# Patient Record
Sex: Female | Born: 1990 | Race: White | Hispanic: No | Marital: Single | Smoking: Never smoker
Health system: Southern US, Community
[De-identification: ages and names within clinical notes are randomized; demographics above are authoritative.]

## PROBLEM LIST (undated history)

## (undated) DIAGNOSIS — L309 Dermatitis, unspecified: Secondary | ICD-10-CM

---

## 2014-04-06 DIAGNOSIS — Z8742 Personal history of other diseases of the female genital tract: Secondary | ICD-10-CM | POA: Insufficient documentation

## 2019-08-30 DIAGNOSIS — K602 Anal fissure, unspecified: Secondary | ICD-10-CM | POA: Insufficient documentation

## 2020-02-12 ENCOUNTER — Emergency Department: Admit: 2020-02-12 | Discharge status: Absent without leave | Payer: Self-pay

## 2020-02-12 ENCOUNTER — Encounter: Payer: Self-pay | Admitting: Emergency Medicine

## 2020-02-12 ENCOUNTER — Emergency Department (INDEPENDENT_AMBULATORY_CARE_PROVIDER_SITE_OTHER)
Admission: EM | Admit: 2020-02-12 | Discharge: 2020-02-12 | Disposition: A | Discharge status: Home / Self Care | Payer: Medicaid Other | Source: Home / Self Care | Attending: Family Medicine | Admitting: Family Medicine

## 2020-02-12 ENCOUNTER — Emergency Department (INDEPENDENT_AMBULATORY_CARE_PROVIDER_SITE_OTHER): Payer: Medicaid Other

## 2020-02-12 ENCOUNTER — Other Ambulatory Visit: Payer: Self-pay

## 2020-02-12 DIAGNOSIS — R358 Other polyuria: Secondary | ICD-10-CM

## 2020-02-12 DIAGNOSIS — R3589 Other polyuria: Secondary | ICD-10-CM

## 2020-02-12 DIAGNOSIS — N940 Mittelschmerz: Secondary | ICD-10-CM

## 2020-02-12 DIAGNOSIS — R1032 Left lower quadrant pain: Secondary | ICD-10-CM

## 2020-02-12 LAB — POCT URINALYSIS DIP (MANUAL ENTRY)
Bilirubin, UA: NEGATIVE
Blood, UA: NEGATIVE
Glucose, UA: NEGATIVE mg/dL
Ketones, POC UA: NEGATIVE mg/dL
Leukocytes, UA: NEGATIVE
Nitrite, UA: NEGATIVE
Protein Ur, POC: NEGATIVE mg/dL
Spec Grav, UA: 1.025 (ref 1.010–1.025)
Urobilinogen, UA: 0.2 E.U./dL
pH, UA: 5.5 (ref 5.0–8.0)

## 2020-02-12 LAB — POCT URINE PREGNANCY: Preg Test, Ur: NEGATIVE

## 2020-02-12 NOTE — ED Triage Notes (Signed)
Lower Lt Abdominal pain radiates to back started yesterday, Polyuria Unvaccinated

## 2020-02-12 NOTE — ED Provider Notes (Signed)
[facility]    CSN: [id number] Arrival date & time: 02/12/20  0932      History   Chief Complaint Chief Complaint  Patient presents with  . Abdominal Pain    HPI Katelyn Ramos is a 29 y.o. female.   Yesterday patient suddenly experienced a sharp left lower abdominal pain.  The pain has started to radiate to her left abdomen and flank.  She has increased urinary frequency without dysuria or hematuria.  She feels well otherwise without nausea/vomiting or fevers, chills, and sweats.  Her last menstrual period ended one week ago (She admits that her previous period was her first one since she discontinued DepoProvera).  She has noted some mucous vaginal discharge without pelvic pain.  She notes that she sleeps on her left side  The history is provided by the patient.    History reviewed. No pertinent past medical history.  There are no problems to display for this patient.   Past Surgical History:  Procedure Laterality Date  . CESAREAN SECTION      OB History   No obstetric history on file.      Home Medications    Prior to Admission medications   Not on File    Family History Family History  Problem Relation Age of Onset  . Healthy Mother   . Healthy Father     Social History Social History   Tobacco Use  . Smoking status: Never Smoker  . Smokeless tobacco: Never Used  Vaping Use  . Vaping Use: Never used  Substance Use Topics  . Alcohol use: Not Currently  . Drug use: Not on file     Allergies   Cephalexin and Septra [sulfamethoxazole-trimethoprim]   Review of Systems Review of Systems  Constitutional: Negative for activity change, appetite change, chills, diaphoresis, fatigue and fever.  Respiratory: Negative for shortness of breath.   Cardiovascular: Negative for chest pain.  Gastrointestinal: Positive for abdominal pain. Negative for abdominal distention, blood in stool, constipation, diarrhea, nausea and vomiting.    Genitourinary: Positive for frequency and vaginal discharge. Negative for dysuria, hematuria, pelvic pain, urgency, vaginal bleeding and vaginal pain.  Skin: Negative for rash.  All other systems reviewed and are negative.    Physical Exam Triage Vital Signs ED Triage Vitals  Enc Vitals Group     BP 02/12/20 1022 109/75     Pulse Rate 02/12/20 1022 87     Resp 02/12/20 1022 18     Temp 02/12/20 1022 98.3 F (36.8 C)     Temp Source 02/12/20 1022 Oral     SpO2 02/12/20 1022 99 %     Weight 02/12/20 1023 145 lb (65.8 kg)     Height 02/12/20 1023 5\' 3"  (1.6 m)     Head Circumference --      Peak Flow --      Pain Score 02/12/20 1023 6     Pain Loc --      Pain Edu? --      Excl. in GC? --    No data found.  Updated Vital Signs BP 109/75 (BP Location: Right Arm)   Pulse 87   Temp 98.3 F (36.8 C) (Oral)   Resp 18   Ht 5\' 3"  (1.6 m)   Wt 65.8 kg   LMP 12/21/2019 (Exact Date)   SpO2 99%   BMI 25.69 kg/m   Visual Acuity Right Eye Distance:   Left Eye Distance:   Bilateral Distance:  Right Eye Near:   Left Eye Near:    Bilateral Near:     Physical Exam Vitals and nursing note reviewed.  Constitutional:      General: She is not in acute distress. HENT:     Head: Normocephalic.     Right Ear: External ear normal.     Left Ear: External ear normal.     Nose: Nose normal.     Mouth/Throat:     Pharynx: Oropharynx is clear.  Eyes:     Pupils: Pupils are equal, round, and reactive to light.  Cardiovascular:     Rate and Rhythm: Normal rate and regular rhythm.     Heart sounds: Normal heart sounds.  Pulmonary:     Breath sounds: Normal breath sounds.  Abdominal:     General: Bowel sounds are normal. There is no distension.     Tenderness: There is abdominal tenderness in the left lower quadrant. There is no right CVA tenderness, left CVA tenderness or rebound. Negative signs include psoas sign and obturator sign.     Hernia: No hernia is present.     Musculoskeletal:     Right lower leg: No edema.     Left lower leg: No edema.  Lymphadenopathy:     Cervical: No cervical adenopathy.  Skin:    General: Skin is warm and dry.     Findings: No rash.  Neurological:     Mental Status: She is alert and oriented to person, place, and time.      UC Treatments / Results  Labs (all labs ordered are listed, but only abnormal results are displayed) Labs Reviewed  POCT URINALYSIS DIP (MANUAL ENTRY)  Ref Range & Units 2 d ago  Color, UA yellow yellow   Clarity, UA clear clear   Glucose, UA negative mg/dL negative   Bilirubin, UA negative negative   Ketones, POC UA negative mg/dL negative   Spec Grav, UA 1.010 - 1.025 1.025   Blood, UA negative negative   pH, UA 5.0 - 8.0 5.5   Protein Ur, POC negative mg/dL negative   Urobilinogen, UA 0.2 or 1.0 E.U./dL 0.2   Nitrite, UA Negative Negative   Leukocytes, UA Negative Negative      POCT URINE PREGNANCY negatuve    EKG   Radiology US PELVIC COMPLETE WITH TRANSVAGINAL  Result Date: 02/12/2020 CLINICAL DATA:  Intermittent left lower quadrant pain for 1 day. EXAM: TRANSABDOMINAL AND TRANSVAGINAL ULTRASOUND OF PELVIS TECHNIQUE: Both transabdominal and transvaginal ultrasound examinations of the pelvis were performed. Transabdominal technique was performed for global imaging of the pelvis including uterus, ovaries, adnexal regions, and pelvic cul-de-sac. It was necessary to proceed with endovaginal exam following the transabdominal exam to visualize the ovaries. COMPARISON:  None FINDINGS: Uterus Measurements: 7.2 x 4.3 x 6.6 cm = volume: 107 mL. No fibroids or other mass visualized. Endometrium Thickness: 12 mm.  No focal abnormality visualized. Right ovary Measurements: 3.3 x 2.2 x 2.4 cm = volume: 9 mL. Normal appearance/no adnexal mass. Left ovary Measurements: 2.6 x 1.6 x 1.3 cm = volume: 3 mL. Normal appearance/no adnexal mass. Other findings Small volume intraperitoneal free fluid.  IMPRESSION: Small volume intraperitoneal free fluid. This may be physiologic in a reproductive age female. Otherwise unremarkable exam. Electronically Signed   By: Kennith Center M.D.   On: 02/12/2020 12:39    Procedures Procedures (including critical care time)  Medications Ordered in UC Medications - No data to display  Initial Impression / Assessment and  Plan / UC Course  I have reviewed the triage vital signs and the nursing notes.  Pertinent labs & imaging results that were available during my care of the patient were reviewed by me and considered in my medical decision making (see chart for details).    Urinalysis negative and pelvic US/transvaginal benign.  Symptoms consistent with Mittelschmerz. Treat symptomatically for now.  Followup with GYN if not improving one week.   Final Clinical Impressions(s) / UC Diagnoses   Final diagnoses:  Polyuria  Mittelschmerz     Discharge Instructions     May take Ibuprofen 200mg , 4 tabs every 8 hours with food.  May take Tylenol as needed for pain.  If symptoms become significantly worse during the night or over the weekend, proceed to the local emergency room.     ED Prescriptions    None        , MD 02/14/20 1132

## 2020-02-12 NOTE — Discharge Instructions (Addendum)
May take Ibuprofen 200mg, 4 tabs every 8 hours with food.  May take Tylenol as needed for pain.  If symptoms become significantly worse during the night or over the weekend, proceed to the local emergency room.  

## 2020-03-27 ENCOUNTER — Other Ambulatory Visit: Payer: Self-pay

## 2020-03-27 ENCOUNTER — Emergency Department (INDEPENDENT_AMBULATORY_CARE_PROVIDER_SITE_OTHER)
Admission: RE | Admit: 2020-03-27 | Discharge: 2020-03-27 | Disposition: A | Discharge status: Home / Self Care | Payer: Medicaid Other | Source: Ambulatory Visit

## 2020-03-27 VITALS — BP 103/71 | HR 89 | Temp 97.8°F | Resp 18

## 2020-03-27 DIAGNOSIS — R1110 Vomiting, unspecified: Secondary | ICD-10-CM

## 2020-03-27 DIAGNOSIS — Z1152 Encounter for screening for COVID-19: Secondary | ICD-10-CM

## 2020-03-27 DIAGNOSIS — J069 Acute upper respiratory infection, unspecified: Secondary | ICD-10-CM

## 2020-03-27 NOTE — ED Triage Notes (Signed)
Pt states that she has been coughing and congested since yesterday. Pt had one emesis episode yesterday about 4 hours after a normal dinner. Pt is aox4 and ambulatory.

## 2020-03-27 NOTE — Discharge Instructions (Signed)
  You may take 500mg acetaminophen every 4-6 hours or in combination with ibuprofen 400-600mg every 6-8 hours as needed for pain, inflammation, and fever.  Be sure to well hydrated with clear liquids and get at least 8 hours of sleep at night, preferably more while sick.   Please follow up with family medicine in 1 week if needed.  Due to concern for possibly having Covid-19, it is advised that you self-isolate at home until test results come back, usually 2-3 days.  If positive, it is recommended you stay isolated for at least 10 days after symptom onset and 24 hours after last fever without taking medication (whichever is longer).  If you MUST go out, please wear a mask at all times, limit contact with others.   If your test is negative, you still have plenty of time to get the Covid vaccine. It is recommended you schedule an appointment to get your vaccine once you get over this current illness.  Please ask your primary care provider about any questions/concerns related to the vaccine.   

## 2020-03-27 NOTE — ED Provider Notes (Signed)
[facility]    CSN: [id number] Arrival date & time: 03/27/20  1703      History   Chief Complaint Chief Complaint  Patient presents with  . Cough    since yesterday  . Nasal Congestion    since yesterday  . Emesis    x 1 episode last night    HPI Katelyn Ramos is a 29 y.o. female.   HPI  Katelyn Ramos is a 29 y.o. female presenting to UC with c/o nasal congestion and mildly productive cough since yesterday. Associated one episode of post-tussive vomiting last night after dinner.  Mild generalized HA and scratchy throat. Denies fever, chills, nausea or diarrhea. Her daughter has similar symptoms that started two days ago. Daughter has doctor's appointment tomorrow. Pt has not been vaccinated for COVID. Denies chest pain or SOB.   History reviewed. No pertinent past medical history.  There are no problems to display for this patient.   Past Surgical History:  Procedure Laterality Date  . CESAREAN SECTION      OB History   No obstetric history on file.      Home Medications    Prior to Admission medications   Not on File    Family History Family History  Problem Relation Age of Onset  . Healthy Mother   . Healthy Father     Social History Social History   Tobacco Use  . Smoking status: Never Smoker  . Smokeless tobacco: Never Used  Vaping Use  . Vaping Use: Never used  Substance Use Topics  . Alcohol use: Not Currently  . Drug use: Not on file     Allergies   Cephalexin and Septra [sulfamethoxazole-trimethoprim]   Review of Systems Review of Systems  Constitutional: Negative for chills and fever.  HENT: Positive for congestion and sore throat. Negative for ear pain, trouble swallowing and voice change.   Respiratory: Positive for cough. Negative for shortness of breath.   Cardiovascular: Negative for chest pain and palpitations.  Gastrointestinal: Positive for vomiting. Negative for abdominal pain, diarrhea and nausea.   Musculoskeletal: Negative for arthralgias, back pain and myalgias.  Skin: Negative for rash.  Neurological: Positive for headaches. Negative for dizziness and light-headedness.  All other systems reviewed and are negative.    Physical Exam Triage Vital Signs ED Triage Vitals  Enc Vitals Group     BP 03/27/20 1718 103/71     Pulse Rate 03/27/20 1718 89     Resp 03/27/20 1718 18     Temp 03/27/20 1718 97.8 F (36.6 C)     Temp Source 03/27/20 1718 Oral     SpO2 03/27/20 1718 99 %     Weight --      Height --      Head Circumference --      Peak Flow --      Pain Score 03/27/20 1719 0     Pain Loc --      Pain Edu? --      Excl. in GC? --    No data found.  Updated Vital Signs BP 103/71 (BP Location: Left Arm)   Pulse 89   Temp 97.8 F (36.6 C) (Oral)   Resp 18   LMP 03/24/2020 (Exact Date)   SpO2 99%   Visual Acuity Right Eye Distance:   Left Eye Distance:   Bilateral Distance:    Right Eye Near:   Left Eye Near:    Bilateral Near:     Physical  Exam Vitals and nursing note reviewed.  Constitutional:      General: She is not in acute distress.    Appearance: Normal appearance. She is well-developed. She is not ill-appearing, toxic-appearing or diaphoretic.  HENT:     Head: Normocephalic and atraumatic.     Right Ear: Tympanic membrane and ear canal normal.     Left Ear: Tympanic membrane and ear canal normal.     Nose: Nose normal.     Mouth/Throat:     Mouth: Mucous membranes are moist.     Pharynx: Oropharynx is clear. No oropharyngeal exudate or posterior oropharyngeal erythema.  Eyes:     Extraocular Movements: Extraocular movements intact.     Conjunctiva/sclera: Conjunctivae normal.     Pupils: Pupils are equal, round, and reactive to light.  Cardiovascular:     Rate and Rhythm: Normal rate and regular rhythm.  Pulmonary:     Effort: Pulmonary effort is normal. No respiratory distress.     Breath sounds: Normal breath sounds. No stridor. No  wheezing, rhonchi or rales.  Musculoskeletal:        General: Normal range of motion.     Cervical back: Normal range of motion and neck supple. No tenderness.  Lymphadenopathy:     Cervical: No cervical adenopathy.  Skin:    General: Skin is warm and dry.  Neurological:     Mental Status: She is alert and oriented to person, place, and time.  Psychiatric:        Behavior: Behavior normal.      UC Treatments / Results  Labs (all labs ordered are listed, but only abnormal results are displayed) Labs Reviewed  NOVEL CORONAVIRUS, NAA    EKG   Radiology No results found.  Procedures Procedures (including critical care time)  Medications Ordered in UC Medications - No data to display  Initial Impression / Assessment and Plan / UC Course  I have reviewed the triage vital signs and the nursing notes.  Pertinent labs & imaging results that were available during my care of the patient were reviewed by me and considered in my medical decision making (see chart for details).    No evidence of bacterial infection on exam Encouraged symptomatic tx at this time COVID PCR test pending F/u with PCP next week as needed  Final Clinical Impressions(s) / UC Diagnoses   Final diagnoses:  Encounter for screening for COVID-19  Viral URI with cough  Post-tussive vomiting     Discharge Instructions     You may take 500mg  acetaminophen every 4-6 hours or in combination with ibuprofen 400-600mg  every 6-8 hours as needed for pain, inflammation, and fever.  Be sure to well hydrated with clear liquids and get at least 8 hours of sleep at night, preferably more while sick.   Please follow up with family medicine in 1 week if needed.  Due to concern for possibly having Covid-19, it is advised that you self-isolate at home until test results come back, usually 2-3 days.  If positive, it is recommended you stay isolated for at least 10 days after symptom onset and 24 hours after last  fever without taking medication (whichever is longer).  If you MUST go out, please wear a mask at all times, limit contact with others.   If your test is negative, you still have plenty of time to get the Covid vaccine. It is recommended you schedule an appointment to get your vaccine once you get over this current illness.  Please ask your primary care provider about any questions/concerns related to the vaccine.      ED Prescriptions    None     PDMP not reviewed this encounter.   Lurene Shadow, PA-C 03/27/20 1800

## 2020-03-29 LAB — SARS-COV-2, NAA 2 DAY TAT

## 2020-03-29 LAB — NOVEL CORONAVIRUS, NAA: SARS-CoV-2, NAA: NOT DETECTED

## 2020-07-28 ENCOUNTER — Other Ambulatory Visit: Payer: Self-pay

## 2020-07-28 ENCOUNTER — Emergency Department (INDEPENDENT_AMBULATORY_CARE_PROVIDER_SITE_OTHER)
Admission: RE | Admit: 2020-07-28 | Discharge: 2020-07-28 | Disposition: A | Discharge status: Home / Self Care | Payer: Medicaid Other | Source: Ambulatory Visit

## 2020-07-28 VITALS — BP 123/86 | HR 81 | Temp 98.5°F | Resp 16 | Ht 63.0 in | Wt 165.0 lb

## 2020-07-28 DIAGNOSIS — L2089 Other atopic dermatitis: Secondary | ICD-10-CM

## 2020-07-28 DIAGNOSIS — R21 Rash and other nonspecific skin eruption: Secondary | ICD-10-CM | POA: Diagnosis not present

## 2020-07-28 HISTORY — DX: Dermatitis, unspecified: L30.9

## 2020-07-28 MED ORDER — CARBINOXAMINE MALEATE 4 MG PO TABS: 4.0000 mg | ORAL_TABLET | Freq: Four times a day (QID) | ORAL | 0 refills | Status: AC | PRN

## 2020-07-28 MED ORDER — DEXAMETHASONE SODIUM PHOSPHATE 10 MG/ML IJ SOLN
10.0000 mg | Freq: Once | INTRAMUSCULAR | Status: AC
  Administered 2020-07-28: 10 mg via INTRAMUSCULAR

## 2020-07-28 MED ORDER — CLOBETASOL PROPIONATE 0.05 % EX GEL: CUTANEOUS | 0 refills | Status: AC

## 2020-07-28 MED ORDER — PREDNISONE 10 MG (21) PO TBPK: ORAL_TABLET | Freq: Every day | ORAL | 0 refills | Status: AC

## 2020-07-28 NOTE — Discharge Instructions (Addendum)
You have received a steroid  injection in the office today  I have sent in a prednisone taper for you to take for 6 days. 6 tablets on day one, 5 tablets on day two, 4 tablets on day three, 3 tablets on day four, 2 tablets on day five, and 1 tablet on day six.  I have sent in carbinoxamine for you to take every 6 hours as needed for itching  I have also sent in clobetasol for you to apply to the area twice a day. Do not put this on your face.  Follow up with allergist

## 2020-07-28 NOTE — ED Triage Notes (Signed)
Pt has an ongoing rash to chest  Was seen recently seen at the dermatologist - started on a new med Called dermatologist who directed pt to go to [facility] for steroids No COVID vaccine

## 2020-07-28 NOTE — ED Provider Notes (Signed)
[facility]   [id number] 07/28/20 Arrival Time: 1743  CC: RASH  SUBJECTIVE:  Laterrica Libman is a 30 y.o. female who presents with a skin complaint that began a couple of months ago. Reports that she is being followed by dermatology. States that she started Dupixent at her last visit. Denies precipitating event or trauma. Denies changes in soaps, detergents, close contacts with similar rash, known trigger or environmental trigger, allergy. Denies medications change or starting a new medication recently.  Localizes the rash to chest, neck and trunk. Describes it as red, raised and itchy. Has tried benadryl, cortisone cream, hydroxyzine, steroids without relief. Denies fever, chills, nausea, vomiting, discharge, oral lesions, SOB, chest pain, abdominal pain, changes in bowel or bladder function.    ROS: As per HPI.  All other pertinent ROS negative.     Past Medical History:  Diagnosis Date  . Eczema    Past Surgical History:  Procedure Laterality Date  . CESAREAN SECTION     Allergies  Allergen Reactions  . Penicillins     Other reaction(s): Other (See Comments) Pt states can not remember.  Can take Amoxicillin Pt states can not remember.  Can take Amoxicillin   . Cephalexin   . Septra [Sulfamethoxazole-Trimethoprim]    No current facility-administered medications on file prior to encounter.   No current outpatient medications on file prior to encounter.   Social History   Socioeconomic History  . Marital status: Single    Spouse name: Not on file  . Number of children: Not on file  . Years of education: Not on file  . Highest education level: Not on file  Occupational History  . Not on file  Tobacco Use  . Smoking status: Never Smoker  . Smokeless tobacco: Never Used  Vaping Use  . Vaping Use: Never used  Substance and Sexual Activity  . Alcohol use: Not Currently  . Drug use: Never  . Sexual activity: Yes  Other Topics Concern  . Not on file   Social History Narrative  . Not on file   Social Determinants of Health   Financial Resource Strain: Not on file  Food Insecurity: Not on file  Transportation Needs: Not on file  Physical Activity: Not on file  Stress: Not on file  Social Connections: Not on file  Intimate Partner Violence: Not on file   Family History  Problem Relation Age of Onset  . Healthy Mother   . Healthy Father     OBJECTIVE: Vitals:   07/28/20 1754 07/28/20 1755  BP: 123/86   Pulse: 81   Resp: 16   Temp: 98.5 F (36.9 C)   TempSrc: Oral   SpO2: 100%   Weight:  165 lb (74.8 kg)  Height:  5\' 3"  (1.6 m)    General appearance: alert; no distress Head: NCAT Lungs: clear to auscultation bilaterally Heart: regular rate and rhythm.  Radial pulse 2+ bilaterally Extremities: no edema Skin: warm and dry; contiguous erythematous, warm, thickened rash to neck and chest, dry and lichenified skin to the eyelids and face Psychological: alert and cooperative; normal mood and affect  ASSESSMENT & PLAN:  1. Rash and nonspecific skin eruption   2. Other atopic dermatitis     Meds ordered this encounter  Medications  . dexamethasone (DECADRON) injection 10 mg  . Carbinoxamine Maleate 4 MG TABS    Sig: Take 1 tablet (4 mg total) by mouth every 6 (six) hours as needed.    Dispense:  90 tablet  Refill:  0    Order Specific Question:   Supervising Provider    Answer:   Merrilee Jansky [id number]  . predniSONE (STERAPRED UNI-PAK 21 TAB) 10 MG (21) TBPK tablet    Sig: Take by mouth daily for 6 days. Take 6 tablets on day 1, 5 tablets on day 2, 4 tablets on day 3, 3 tablets on day 4, 2 tablets on day 5, 1 tablet on day 6    Dispense:  21 tablet    Refill:  0    Order Specific Question:   Supervising Provider    Answer:   Merrilee Jansky [id number]  . clobetasol (TEMOVATE) 0.05 % GEL    Sig: Apply to affected area twice daily as needed for rash itching and burning    Dispense:  30 g    Refill:  0     Order Specific Question:   Supervising Provider    Answer:   Merrilee Jansky [[id number]]   Decadron 10mg  IM in office today Prescribed carbinoxamine for itching Steroids prescribed Clobetasol prescribed as well Take as prescribed and to completion Avoid hot showers/ baths Moisturize skin daily  Follow up with allergist to help rule out food/environmental causes of the rash Follow up with PCP if symptoms persists Return or go to the ER if you have any new or worsening symptoms such as fever, chills, nausea, vomiting, redness, swelling, discharge, if symptoms do not improve with medications  Reviewed expectations re: course of current medical issues. Questions answered. Outlined signs and symptoms indicating need for more acute intervention. Patient verbalized understanding. After Visit Summary given.   , NP 07/28/20 1817

## 2020-08-01 ENCOUNTER — Ambulatory Visit: Payer: Self-pay

## 2020-10-14 ENCOUNTER — Telehealth: Payer: Self-pay

## 2020-10-14 ENCOUNTER — Ambulatory Visit: Payer: Self-pay

## 2020-10-14 NOTE — Telephone Encounter (Signed)
Tried to contact pt about reason for visit. Looks like she was seen at [facility] yesterday for incision infection. Rx'd clindamycin. No VM to leave msg.

## 2021-12-12 IMAGING — US US PELVIS COMPLETE WITH TRANSVAGINAL
1 series · 14 of 25 positions shown · non-contrast
Comparison: None

CLINICAL DATA: Intermittent left lower quadrant pain for 1 day.



[Series 1: us pelvis complete with transvaginal · 0.21mm/px · 14 of 122 slices shown]
[im 1/122]
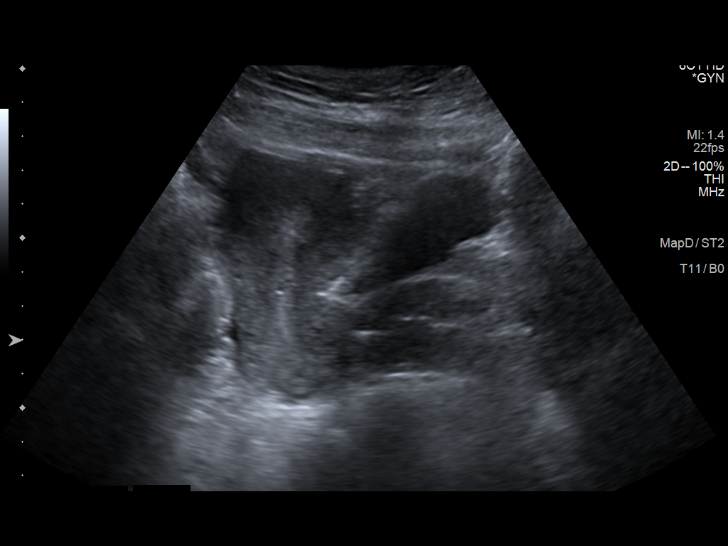
[im 11/122]
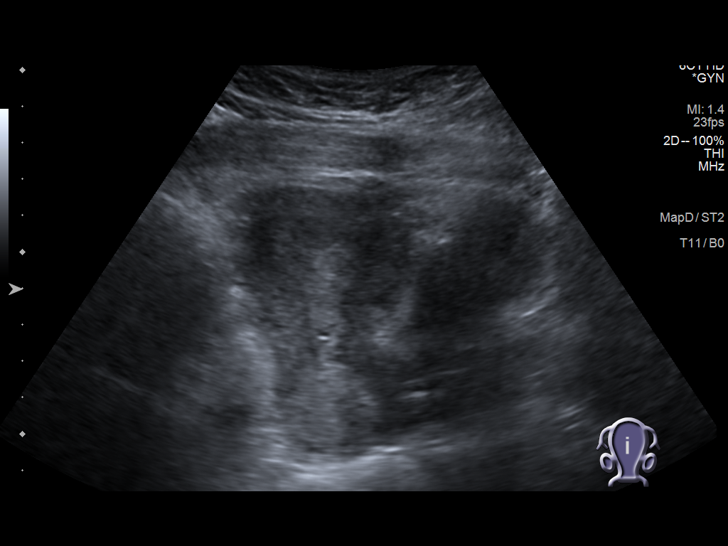
[im 21/122]
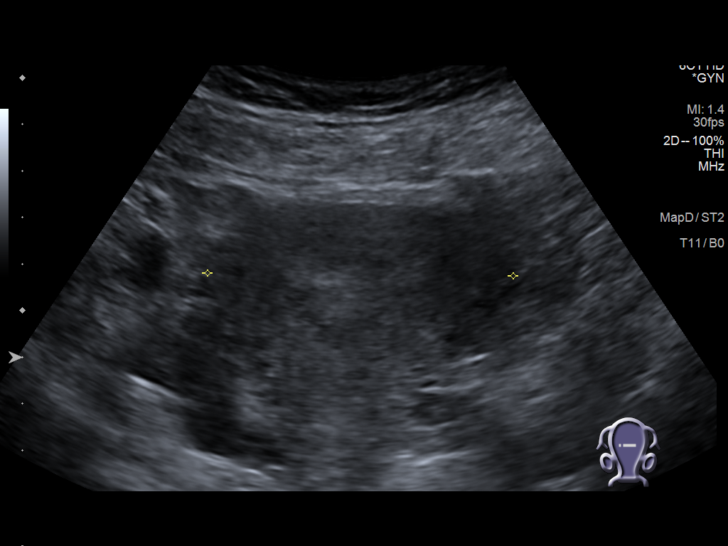
[im 31/122]
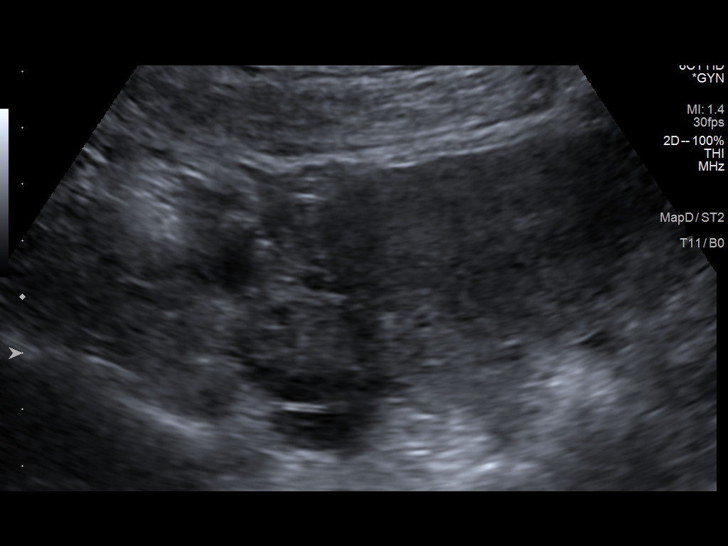
[im 41/122]
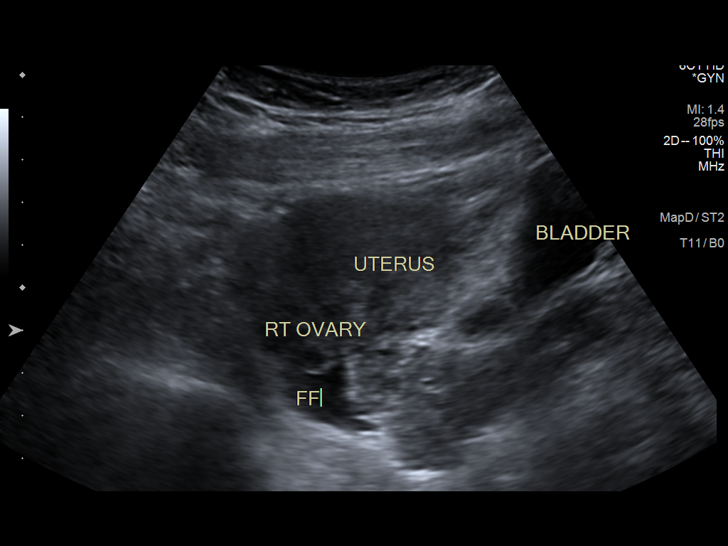
[im 46/122]
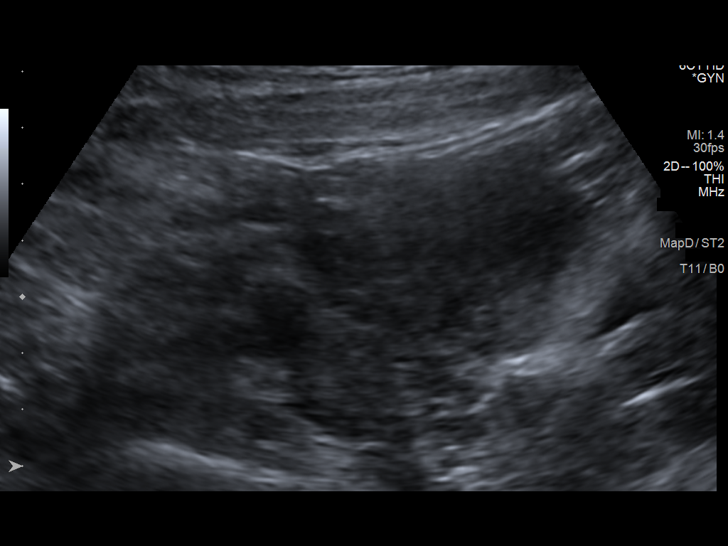
[im 56/122]
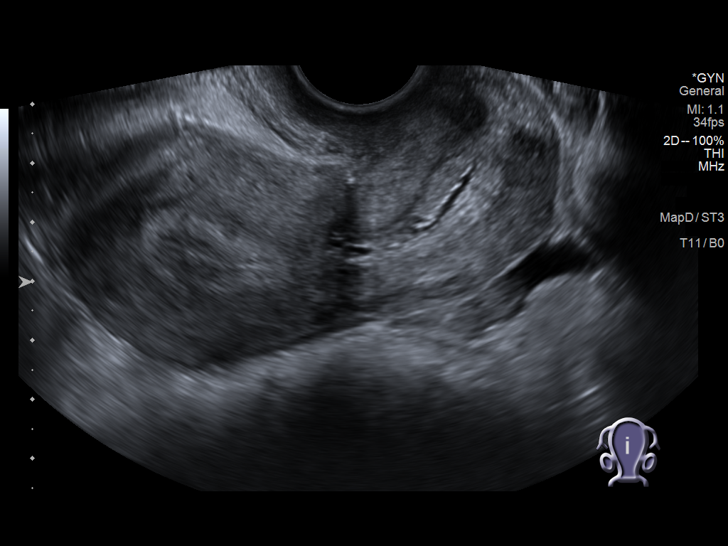
[im 66/122]
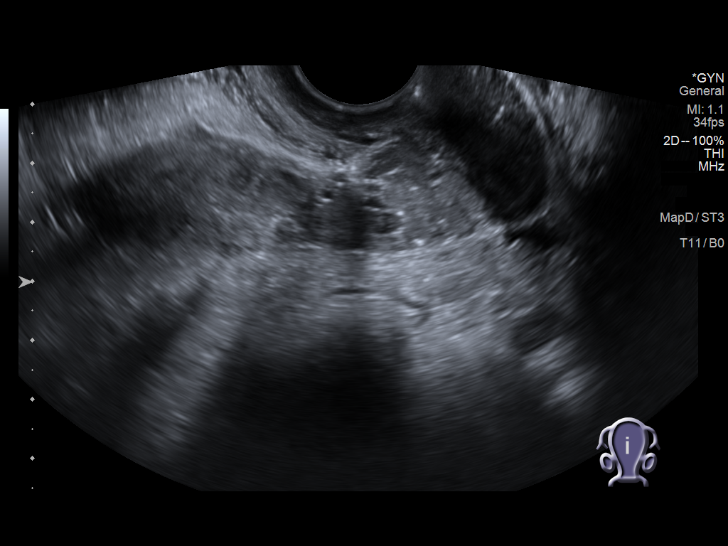
[im 76/122]
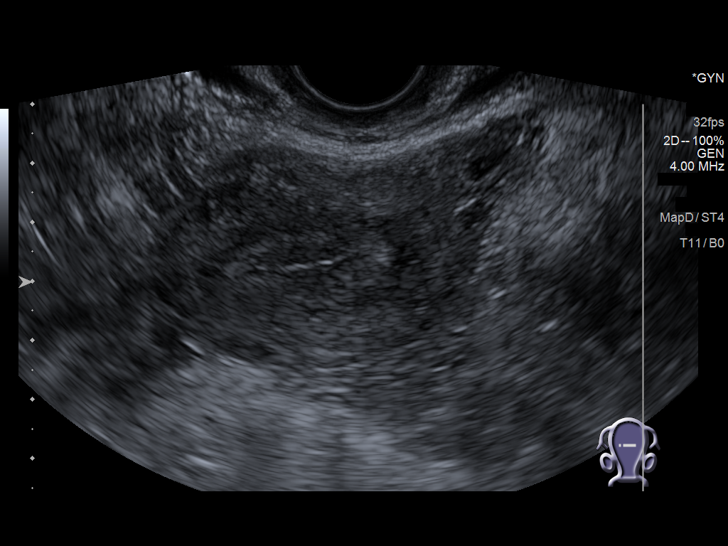
[im 81/122]
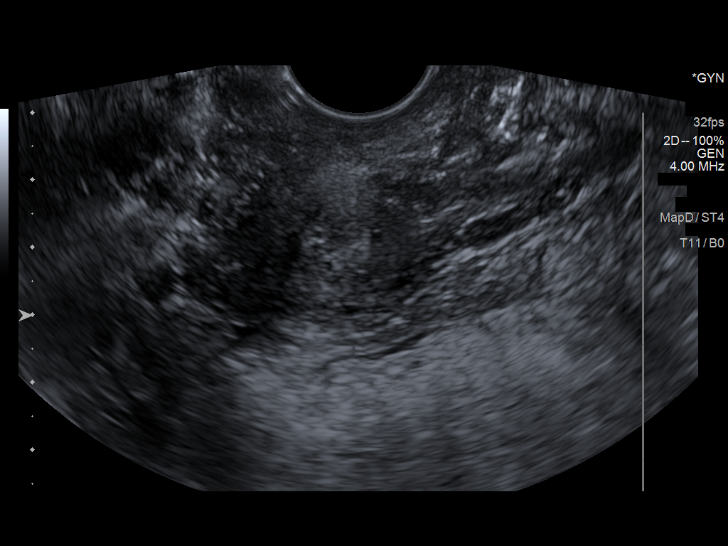
[im 91/122]
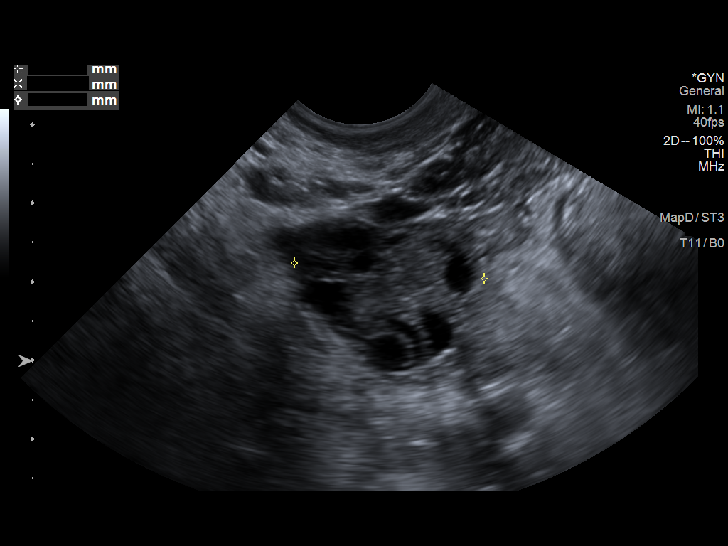
[im 101/122]
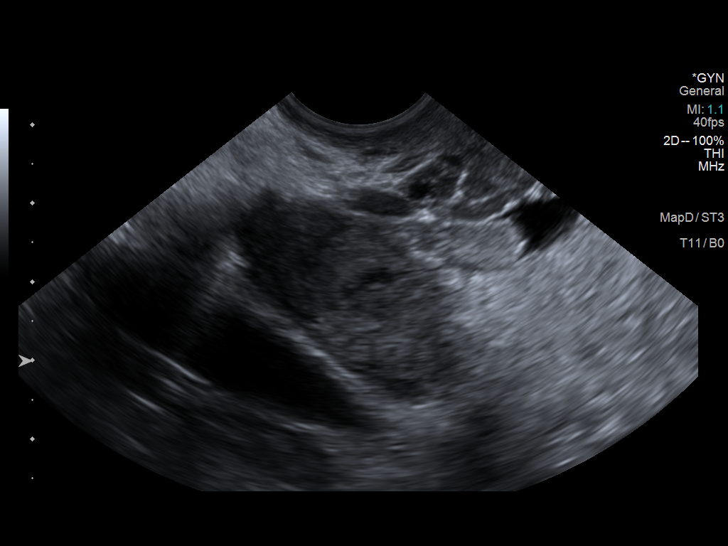
[im 111/122]
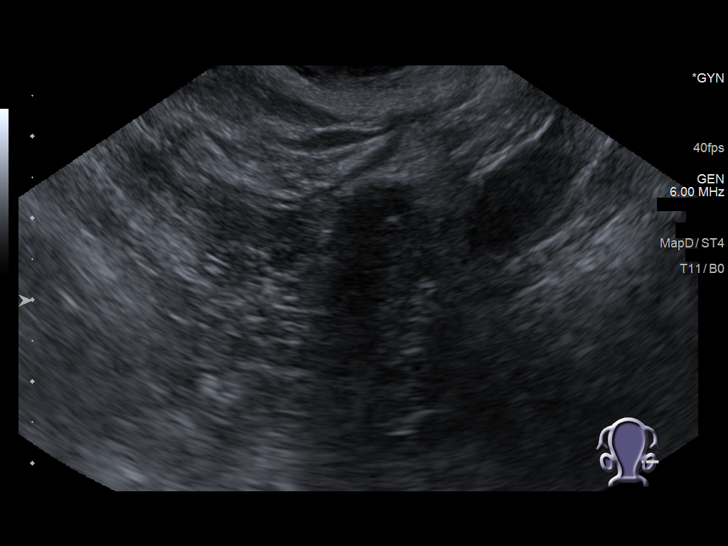
[im 122/122]
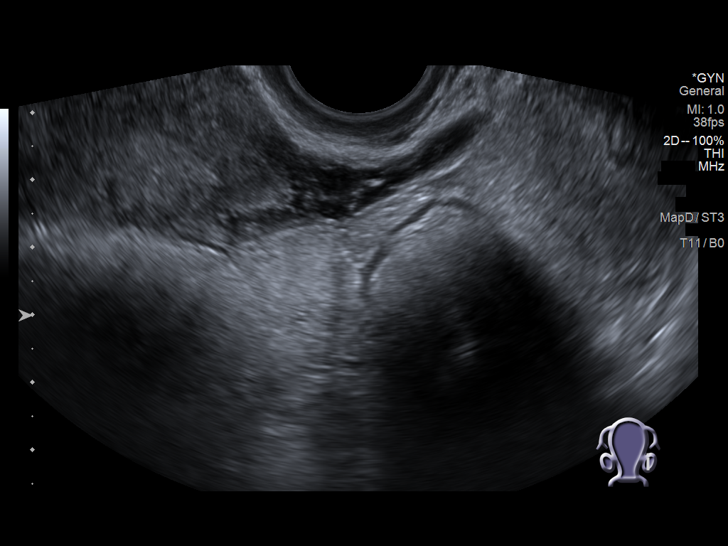

[14 of 25 positions shown; findings below may reference images not displayed]

FINDINGS: Uterus

Measurements: 7.2 x 4.3 x 6.6 cm = volume: 107 mL. No fibroids or
other mass visualized.

Endometrium

Thickness: 12 mm.  No focal abnormality visualized.

Right ovary

Measurements: 3.3 x 2.2 x 2.4 cm = volume: 9 mL. Normal
appearance/no adnexal mass.

Left ovary

Measurements: 2.6 x 1.6 x 1.3 cm = volume: 3 mL. Normal
appearance/no adnexal mass.

Other findings

Small volume intraperitoneal free fluid.
IMPRESSION: Small volume intraperitoneal free fluid. This may be physiologic in
a reproductive age female. Otherwise unremarkable exam.

## 2023-09-21 ENCOUNTER — Telehealth (INDEPENDENT_AMBULATORY_CARE_PROVIDER_SITE_OTHER): Payer: Self-pay | Admitting: Pediatrics

## 2023-09-21 NOTE — Telephone Encounter (Signed)
 Who's calling (name and relationship to patient) : Katelyn Ramos; mom   Best contact number: [phone]  Provider they see: Dr. Arvilla Market   Reason for call: Mom called in stating that she received a text message that results are in and wanted to discuss them. Mom also stated that she recently started medication as well, but this morning she threw up around 4:30 am nothing came up, no food came up, just liquid. Monday and Tuesday she was fine. She is requesting a call back.    Call ID:      PRESCRIPTION REFILL ONLY  Name of prescription:  Pharmacy:

## 2023-09-21 NOTE — Telephone Encounter (Signed)
 A user error has taken place: encounter opened in error, closed for administrative reasons.
